# Patient Record
Sex: Female | Born: 2000 | Race: White | Hispanic: No | Marital: Single | Smoking: Never smoker
Health system: Southern US, Community
[De-identification: ages and names within clinical notes are randomized; demographics above are authoritative.]

## PROBLEM LIST (undated history)

## (undated) ENCOUNTER — Inpatient Hospital Stay (HOSPITAL_COMMUNITY): Payer: Self-pay

## (undated) DIAGNOSIS — F32A Depression, unspecified: Secondary | ICD-10-CM

## (undated) DIAGNOSIS — F419 Anxiety disorder, unspecified: Secondary | ICD-10-CM

## (undated) DIAGNOSIS — F329 Major depressive disorder, single episode, unspecified: Secondary | ICD-10-CM

## (undated) HISTORY — PX: WISDOM TOOTH EXTRACTION: SHX21

---
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
## 2022-02-19 DIAGNOSIS — Z23 Encounter for immunization: Secondary | ICD-10-CM | POA: Diagnosis not present

## 2022-03-02 DIAGNOSIS — R109 Unspecified abdominal pain: Secondary | ICD-10-CM | POA: Diagnosis not present

## 2022-05-02 ENCOUNTER — Ambulatory Visit
Admission: RE | Admit: 2022-05-02 | Discharge: 2022-05-02 | Disposition: A | Discharge status: Home / Self Care | Payer: BC Managed Care – PPO | Source: Ambulatory Visit

## 2022-05-02 VITALS — BP 124/83 | HR 107 | Temp 97.8°F | Resp 18

## 2022-05-02 DIAGNOSIS — R112 Nausea with vomiting, unspecified: Secondary | ICD-10-CM

## 2022-05-02 DIAGNOSIS — R197 Diarrhea, unspecified: Secondary | ICD-10-CM

## 2022-05-02 DIAGNOSIS — A084 Viral intestinal infection, unspecified: Secondary | ICD-10-CM

## 2022-05-02 MED ORDER — ONDANSETRON 4 MG PO TBDP: 4.0000 mg | ORAL_TABLET | Freq: Three times a day (TID) | ORAL | 0 refills | Status: DC | PRN

## 2022-05-02 NOTE — Discharge Instructions (Addendum)
Take the antinausea medication as directed.    Keep yourself hydrated with clear liquids, such as water and Gatorade.  Follow the diarrhea diet as tolerated.   Go to the emergency department if you have worsening symptoms.    Follow up with your primary care provider.      

## 2022-05-02 NOTE — ED Triage Notes (Signed)
Patient to Urgent Care with complaints of NVD/ fatigue/ fevers. Employed at a [employer] office. Unknown sick contacts.   Reports multiple episodes of emesis last night, low grade fever.

## 2022-05-02 NOTE — ED Provider Notes (Signed)
Roderic Palau    CSN: [id number] Arrival date & time: 05/02/22  0935      History   Chief Complaint Chief Complaint  Patient presents with   Diarrhea    Chills, back pain, vomiting, fatigue, fever - Entered by patient    HPI Shannon Murillo is a 22 y.o. female.  Patient presents with low-grade fever of 99.5, nausea, vomiting, diarrhea since yesterday.  No emesis this morning.  2 episodes of diarrhea this morning.  Patient has been able to drink sips of water without vomiting today.  She took Tylenol at 0200 this morning; no medications taken since then.  She denies rash, sore throat, cough, shortness of breath, abdominal pain, flank pain, dysuria, hematuria, or other symptoms.  No recent travel or antibiotic use.   The history is provided by the patient and medical records.    Past Medical History:  Diagnosis Date   Anxiety    Depression     Patient Active Problem List   Diagnosis Date Noted   Epigastric pain 04/17/2019   Bilious vomiting with nausea 04/17/2019   Constipation 04/17/2019   Dysmenorrhea 01/23/2019    Past Surgical History:  Procedure Laterality Date   WISDOM TOOTH EXTRACTION      OB History   No obstetric history on file.      Home Medications    Prior to Admission medications   Medication Sig Start Date End Date Taking? Authorizing Provider  busPIRone (BUSPAR) 5 MG tablet Take 5 mg by mouth 2 (two) times daily. 11/04/21  Yes [provider]  lamoTRIgine (LAMICTAL) 25 MG tablet Take 50 mg by mouth 2 (two) times daily. 11/04/21  Yes [provider]  ondansetron (ZOFRAN-ODT) 4 MG disintegrating tablet Take 1 tablet (4 mg total) by mouth every 8 (eight) hours as needed for nausea or vomiting. 05/02/22  Yes Sharion Balloon, NP  cyclobenzaprine (FLEXERIL) 10 MG tablet Take 1 tablet 4 hours prior to procedure. Then, ok to take 1 tablet after if needed for cramping. Take 800 mg ibuprofen with first dose. Patient not taking: Reported  on 09/29/2021 07/10/21   Trude Mcburney, FNP    Family History No family history on file.  Social History Social History   Tobacco Use   Smoking status: Never   Smokeless tobacco: Never  Substance Use Topics   Alcohol use: No   Drug use: Never     Allergies   Patient has no known allergies.   Review of Systems Review of Systems  Constitutional:  Positive for chills and fever.  HENT:  Negative for ear pain and sore throat.   Respiratory:  Negative for cough and shortness of breath.   Cardiovascular:  Negative for chest pain and palpitations.  Gastrointestinal:  Positive for diarrhea, nausea and vomiting. Negative for abdominal pain.  Genitourinary:  Negative for dysuria and hematuria.  Skin:  Negative for rash.  All other systems reviewed and are negative.    Physical Exam Triage Vital Signs ED Triage Vitals  Enc Vitals Group     BP 05/02/22 0947 124/83     Pulse Rate 05/02/22 0947 (!) 107     Resp 05/02/22 0947 18     Temp 05/02/22 0947 97.8 F (36.6 C)     Temp src --      SpO2 05/02/22 0947 96 %     Weight --      Height --      Head Circumference --  Peak Flow --      Pain Score 05/02/22 0949 1     Pain Loc --      Pain Edu? --      Excl. in [place]? --    No data found.  Updated Vital Signs BP 124/83   Pulse (!) 107   Temp 97.8 F (36.6 C)   Resp 18   SpO2 96%   Visual Acuity Right Eye Distance:   Left Eye Distance:   Bilateral Distance:    Right Eye Near:   Left Eye Near:    Bilateral Near:     Physical Exam Vitals and nursing note reviewed.  Constitutional:      General: She is not in acute distress.    Appearance: Normal appearance. She is well-developed. She is not ill-appearing.  HENT:     Mouth/Throat:     Mouth: Mucous membranes are moist.     Pharynx: Oropharynx is clear.  Cardiovascular:     Rate and Rhythm: Normal rate and regular rhythm.     Heart sounds: Normal heart sounds.  Pulmonary:     Effort: Pulmonary effort  is normal. No respiratory distress.     Breath sounds: Normal breath sounds.  Abdominal:     General: Bowel sounds are normal.     Palpations: Abdomen is soft.     Tenderness: There is no abdominal tenderness. There is no right CVA tenderness, left CVA tenderness, guarding or rebound.  Musculoskeletal:     Cervical back: Neck supple.  Skin:    General: Skin is warm and dry.  Neurological:     Mental Status: She is alert.  Psychiatric:        Mood and Affect: Mood normal.        Behavior: Behavior normal.      UC Treatments / Results  Labs (all labs ordered are listed, but only abnormal results are displayed) Labs Reviewed - No data to display  EKG   Radiology No results found.  Procedures Procedures (including critical care time)  Medications Ordered in UC Medications - No data to display  Initial Impression / Assessment and Plan / UC Course  I have reviewed the triage vital signs and the nursing notes.  Pertinent labs & imaging results that were available during my care of the patient were reviewed by me and considered in my medical decision making (see chart for details).    Nausea, vomiting, diarrhea. Viral gastroenteritis.  Treating nausea and vomiting with Zofran.  Discussed clear liquid diet.  Instructed patient to advance to diarrhea diet as tolerated.  Discussed maintaining oral hydration at home; ED precautions discussed.  Education provided on nausea and vomiting, diarrhea.  Instructed patient to follow-up with her PCP as needed.  She agrees to plan of care.   Final Clinical Impressions(s) / UC Diagnoses   Final diagnoses:  Nausea vomiting and diarrhea  Viral gastroenteritis     Discharge Instructions      Take the antinausea medication as directed.    Keep yourself hydrated with clear liquids, such as water and Gatorade.  Follow the diarrhea diet as tolerated.   Go to the emergency department if you have worsening symptoms.    Follow up with  your primary care provider.          ED Prescriptions     Medication Sig Dispense Auth. Provider   ondansetron (ZOFRAN-ODT) 4 MG disintegrating tablet Take 1 tablet (4 mg total) by mouth every 8 (eight) hours  as needed for nausea or vomiting. 20 tablet Sharion Balloon, NP      PDMP not reviewed this encounter.   Sharion Balloon, NP 05/02/22 1025

## 2022-05-06 ENCOUNTER — Telehealth (INDEPENDENT_AMBULATORY_CARE_PROVIDER_SITE_OTHER): Payer: BC Managed Care – PPO | Admitting: Family

## 2022-05-06 ENCOUNTER — Encounter: Payer: Self-pay | Admitting: Family

## 2022-05-06 DIAGNOSIS — Z975 Presence of (intrauterine) contraceptive device: Secondary | ICD-10-CM

## 2022-05-06 DIAGNOSIS — N946 Dysmenorrhea, unspecified: Secondary | ICD-10-CM | POA: Diagnosis not present

## 2022-05-06 NOTE — Progress Notes (Addendum)
THIS RECORD MAY CONTAIN CONFIDENTIAL INFORMATION THAT SHOULD NOT BE RELEASED WITHOUT REVIEW OF THE SERVICE PROVIDER.  Virtual Follow-Up Visit via Video Note  I connected with Shannon Murillo   on 05/06/22 at 11:00 AM EDT by a video enabled telemedicine application and verified that I am speaking with the correct person using two identifiers.   Patient/parent location: home  Provider location: remote [place]   I discussed the limitations of evaluation and management by telemedicine and the availability of in person appointments.  I discussed that the purpose of this telehealth visit is to provide medical care while limiting exposure to the novel coronavirus.  The patient expressed understanding and agreed to proceed.   Shannon Murillo is a 22 y.o. female referred by Jamie Kato, MD here today for follow-up of IUD in place.   History was provided by the patient.  Supervising Physician: Dr. Roselind Messier   Plan from Last Visit:   1. Encounter for routine checking of intrauterine contraceptive device (IUD) - IUD placement verified by Hoyt Koch, NP in correct stable location - discussed normal cramping and spotting with IUD, can treat cramping with Ibuprofen PRN, can keep symptom journal - discussed concerning symptoms / reasons to return to care including severe abdominal pain/cramping, unusual bleeding, fever or chills, foul-smelling discharge, or painful intercourse   2. Vaginal discharge - Mild cervical discharge physiologically normal, no cervical motion tenderness on exam - discharge and itching/burning improved with fluconazole treatment from PCP, likely yeast infection, however discussed reasons to return for care and the option of STI testing today. Patient has been sexually active but with same monogamous partner, STI testing last visit was negative, reasonable to defer today but discussed if symptoms returned we should pursue testing. She has MyChart and the clinic phone  number.  Chief Complaint: Desires IUD removal   History of Present Illness:  -is feeling better after recent stomach bug; still not great holding food down at times -wants IUD out  -has not had period in 3-4 years and feels like she wants body to have a break from hormones  -has random menstrual cramping but nothing she is ever worried about  -is sexually active  -denies pregnancy intention   No Known Allergies Outpatient Medications Prior to Visit  Medication Sig Dispense Refill   busPIRone (BUSPAR) 5 MG tablet Take 5 mg by mouth 2 (two) times daily.     cyclobenzaprine (FLEXERIL) 10 MG tablet Take 1 tablet 4 hours prior to procedure. Then, ok to take 1 tablet after if needed for cramping. Take 800 mg ibuprofen with first dose. (Patient not taking: Reported on 09/29/2021) 2 tablet 0   lamoTRIgine (LAMICTAL) 25 MG tablet Take 50 mg by mouth 2 (two) times daily.     ondansetron (ZOFRAN-ODT) 4 MG disintegrating tablet Take 1 tablet (4 mg total) by mouth every 8 (eight) hours as needed for nausea or vomiting. 20 tablet 0   No facility-administered medications prior to visit.     Patient Active Problem List   Diagnosis Date Noted   Epigastric pain 04/17/2019   Bilious vomiting with nausea 04/17/2019   Constipation 04/17/2019   Dysmenorrhea 01/23/2019   The following portions of the patient's history were reviewed and updated as appropriate: allergies, current medications, past family history, past medical history, past social history, past surgical history, and problem list.  Visual Observations/Objective:   General Appearance: Well nourished well developed, in no apparent distress.  Eyes: conjunctiva no swelling or erythema ENT/Mouth:  No hoarseness, No cough for duration of visit.  Neck: Supple  Respiratory: Respiratory effort normal, normal rate, no retractions or distress.   Cardio: Appears well-perfused, noncyanotic Musculoskeletal: no obvious deformity Skin: visible skin  without rashes, ecchymosis, erythema Neuro: Awake and oriented X 3,  Psych:  normal affect, Insight and Judgment appropriate.    Assessment/Plan: 1. Dysmenorrhea 2. IUD (intrauterine device) in place -reassurance given that menstrual suppression with IUD is common and not worrisome; there is no reason for concern for endometrial hyperplasia with IUD use; discussed return to fertility is unknown but should assume risk for pregnancy returns immediately upon IUD removal  -would recommend prenatal or daily women's vitamin with folic acid if no contraceptive in place     I discussed the assessment and treatment plan with the patient and/or parent/guardian.  They were provided an opportunity to ask questions and all were answered.  They agreed with the plan and demonstrated an understanding of the instructions. They were advised to call back or seek an in-person evaluation in the emergency room if the symptoms worsen or if the condition fails to improve as anticipated.   Follow-up:  return for IUD removal at patient's [place], NP    CC: Jamie Kato, MD, Jamie Kato, MD

## 2022-05-15 ENCOUNTER — Ambulatory Visit (INDEPENDENT_AMBULATORY_CARE_PROVIDER_SITE_OTHER): Payer: BC Managed Care – PPO | Admitting: Family

## 2022-05-15 ENCOUNTER — Encounter: Payer: Self-pay | Admitting: Family

## 2022-05-15 VITALS — BP 127/81 | HR 77 | Ht 66.0 in | Wt 174.2 lb

## 2022-05-15 DIAGNOSIS — N946 Dysmenorrhea, unspecified: Secondary | ICD-10-CM

## 2022-05-15 DIAGNOSIS — Z113 Encounter for screening for infections with a predominantly sexual mode of transmission: Secondary | ICD-10-CM

## 2022-05-15 DIAGNOSIS — Z30432 Encounter for removal of intrauterine contraceptive device: Secondary | ICD-10-CM

## 2022-05-15 NOTE — Progress Notes (Signed)
History was provided by the patient.  Shannon Murillo is a 22 y.o. female who is here for IUD removal.   PCP confirmed? Yes.    Jamie Kato, MD  HPI:   -has not had period with IUD  -wants removal to have her hormones regulated  -denies pregnancy intent but is willing to start prenatal or DMV with folic acid  -does not want any birth control at this time -safe in relationships   Patient Active Problem List   Diagnosis Date Noted   Epigastric pain 04/17/2019   Bilious vomiting with nausea 04/17/2019   Constipation 04/17/2019   Dysmenorrhea 01/23/2019    Current Outpatient Medications on File Prior to Visit  Medication Sig Dispense Refill   busPIRone (BUSPAR) 5 MG tablet Take 5 mg by mouth 2 (two) times daily.     lamoTRIgine (LAMICTAL) 25 MG tablet Take 50 mg by mouth 2 (two) times daily.     cyclobenzaprine (FLEXERIL) 10 MG tablet Take 1 tablet 4 hours prior to procedure. Then, ok to take 1 tablet after if needed for cramping. Take 800 mg ibuprofen with first dose. (Patient not taking: Reported on 09/29/2021) 2 tablet 0   ondansetron (ZOFRAN-ODT) 4 MG disintegrating tablet Take 1 tablet (4 mg total) by mouth every 8 (eight) hours as needed for nausea or vomiting. (Patient not taking: Reported on 05/15/2022) 20 tablet 0   No current facility-administered medications on file prior to visit.    No Known Allergies  Physical Exam:    Vitals:   05/15/22 1101  BP: 127/81  Pulse: 77  Weight: 174 lb 3.2 oz (79 kg)  Height: 5\' 6"  (1.676 m)    Blood pressure %iles are not available for patients who are 18 years or older. No LMP recorded. (Menstrual status: IUD).  Physical Exam Exam conducted with a chaperone present.  Constitutional:      General: She is not in acute distress.    Appearance: She is well-developed.  HENT:     Head: Normocephalic and atraumatic.  Eyes:     General: No scleral icterus.    Pupils: Pupils are equal, round, and reactive to light.  Neck:      Thyroid: No thyromegaly.  Cardiovascular:     Rate and Rhythm: Normal rate and regular rhythm.     Heart sounds: Normal heart sounds. No murmur heard. Pulmonary:     Effort: Pulmonary effort is normal.     Breath sounds: Normal breath sounds.  Genitourinary:    Vagina: Normal.     Cervix: Discharge present.     Uterus: Normal.      Comments: Strings visible at os prior to removal  White creamy discharge from os  Musculoskeletal:        General: Normal range of motion.     Cervical back: Normal range of motion and neck supple.  Lymphadenopathy:     Cervical: No cervical adenopathy.  Skin:    General: Skin is warm and dry.     Findings: No rash.  Neurological:     Mental Status: She is alert and oriented to person, place, and time.     Cranial Nerves: No cranial nerve deficit.  Psychiatric:        Behavior: Behavior normal.        Thought Content: Thought content normal.        Judgment: Judgment normal.       Assessment/Plan: 1. Dysmenorrhea 2. Encounter for IUD removal -removed without incident;  advised to return as needed for cramping or bleeding concerns; was well controlled with method -patient advised to return as needed  -start DMV or prenatal for folic acid coverage -reviewed EC use available as needed  -return in 3 months; cautioned about missed periods and risks for endometrial hyperplasia   3. Routine screening for STIs

## 2022-05-16 LAB — C. TRACHOMATIS/N. GONORRHOEAE RNA
C. trachomatis RNA, TMA: NOT DETECTED
N. gonorrhoeae RNA, TMA: NOT DETECTED

## 2022-07-31 DIAGNOSIS — M545 Low back pain, unspecified: Secondary | ICD-10-CM | POA: Diagnosis not present

## 2022-07-31 DIAGNOSIS — M549 Dorsalgia, unspecified: Secondary | ICD-10-CM | POA: Diagnosis not present

## 2022-08-17 ENCOUNTER — Ambulatory Visit: Payer: BC Managed Care – PPO | Admitting: Family

## 2022-09-03 ENCOUNTER — Encounter: Payer: Self-pay | Admitting: *Deleted

## 2022-09-18 DIAGNOSIS — F909 Attention-deficit hyperactivity disorder, unspecified type: Secondary | ICD-10-CM | POA: Diagnosis not present

## 2022-10-07 DIAGNOSIS — F909 Attention-deficit hyperactivity disorder, unspecified type: Secondary | ICD-10-CM | POA: Diagnosis not present

## 2022-12-07 ENCOUNTER — Ambulatory Visit
Admission: EM | Admit: 2022-12-07 | Discharge: 2022-12-07 | Disposition: A | Discharge status: Home / Self Care | Payer: BC Managed Care – PPO

## 2022-12-07 DIAGNOSIS — L02214 Cutaneous abscess of groin: Secondary | ICD-10-CM

## 2022-12-07 MED ORDER — CEPHALEXIN 500 MG PO CAPS: 500.0000 mg | ORAL_CAPSULE | Freq: Two times a day (BID) | ORAL | 0 refills | Status: DC

## 2022-12-07 NOTE — ED Provider Notes (Signed)
[facility]    CSN: [id number] Arrival date & time: 12/07/22  1544      History   Chief Complaint Chief Complaint  Patient presents with   Abscess    HPI Shannon Murillo is a 22 y.o. female.   Patient presenting today with new onset of a painful red bump to the left groin region that she first noticed this morning.  She denies injury to the area, drainage, fever, chills, rashes elsewhere.  So far not tried anything over-the-counter for symptoms.    Past Medical History:  Diagnosis Date   Anxiety    Depression     Patient Active Problem List   Diagnosis Date Noted   Epigastric pain 04/17/2019   Bilious vomiting with nausea 04/17/2019   Constipation 04/17/2019   Dysmenorrhea 01/23/2019    Past Surgical History:  Procedure Laterality Date   WISDOM TOOTH EXTRACTION      OB History   No obstetric history on file.      Home Medications    Prior to Admission medications   Medication Sig Start Date End Date Taking? Authorizing Provider  amphetamine-dextroamphetamine (ADDERALL) 5 MG tablet Take 1 tablet by mouth 2 (two) times daily. 11/27/22  Yes [provider]  cephALEXin (KEFLEX) 500 MG capsule Take 1 capsule (500 mg total) by mouth 2 (two) times daily. 12/07/22  Yes Particia Nearing, PA-C  busPIRone (BUSPAR) 5 MG tablet Take 5 mg by mouth 2 (two) times daily. 11/04/21   [provider]  lamoTRIgine (LAMICTAL) 25 MG tablet Take 50 mg by mouth 2 (two) times daily. 11/04/21   [provider]    Family History History reviewed. No pertinent family history.  Social History Social History   Tobacco Use   Smoking status: Never   Smokeless tobacco: Never  Substance Use Topics   Alcohol use: No   Drug use: Never     Allergies   Patient has no known allergies.   Review of Systems Review of Systems Per HPI  Physical Exam Triage Vital Signs ED Triage Vitals  Encounter Vitals Group     BP 12/07/22 1720  124/80     Systolic BP Percentile --      Diastolic BP Percentile --      Pulse Rate 12/07/22 1720 76     Resp 12/07/22 1720 16     Temp 12/07/22 1720 98.7 F (37.1 C)     Temp Source 12/07/22 1720 Oral     SpO2 12/07/22 1720 98 %     Weight --      Height --      Head Circumference --      Peak Flow --      Pain Score 12/07/22 1726 2     Pain Loc --      Pain Education --      Exclude from Growth Chart --    No data found.  Updated Vital Signs BP 124/80 (BP Location: Right Arm)   Pulse 76   Temp 98.7 F (37.1 C) (Oral)   Resp 16   LMP 11/24/2022 (Approximate)   SpO2 98%   Visual Acuity Right Eye Distance:   Left Eye Distance:   Bilateral Distance:    Right Eye Near:   Left Eye Near:    Bilateral Near:     Physical Exam Vitals and nursing note reviewed.  Constitutional:      Appearance: Normal appearance. She is not ill-appearing.  HENT:  Head: Atraumatic.     Mouth/Throat:     Mouth: Mucous membranes are moist.  Eyes:     Extraocular Movements: Extraocular movements intact.     Conjunctiva/sclera: Conjunctivae normal.  Cardiovascular:     Rate and Rhythm: Normal rate and regular rhythm.     Heart sounds: Normal heart sounds.  Pulmonary:     Effort: Pulmonary effort is normal.     Breath sounds: Normal breath sounds.  Musculoskeletal:        General: Normal range of motion.     Cervical back: Normal range of motion and neck supple.  Skin:    General: Skin is warm.     Comments: 1 cm erythematous fluctuant mass to the left groin, no active drainage, tender to palpation  Neurological:     Mental Status: She is alert and oriented to person, place, and time.     Motor: No weakness.     Gait: Gait normal.  Psychiatric:        Mood and Affect: Mood normal.        Thought Content: Thought content normal.        Judgment: Judgment normal.      UC Treatments / Results  Labs (all labs ordered are listed, but only abnormal results are  displayed) Labs Reviewed - No data to display  EKG   Radiology No results found.  Procedures Procedures (including critical care time)  Medications Ordered in UC Medications - No data to display  Initial Impression / Assessment and Plan / UC Course  I have reviewed the triage vital signs and the nursing notes.  Pertinent labs & imaging results that were available during my care of the patient were reviewed by me and considered in my medical decision making (see chart for details).     Consistent with an early abscess, treat with Keflex, warm compresses, ibuprofen as needed for pain.  Return for worsening symptoms.  Final Clinical Impressions(s) / UC Diagnoses   Final diagnoses:  Groin abscess   Discharge Instructions   None    ED Prescriptions     Medication Sig Dispense Auth. Provider   cephALEXin (KEFLEX) 500 MG capsule Take 1 capsule (500 mg total) by mouth 2 (two) times daily. 14 capsule Particia Nearing, [place]      PDMP not reviewed this encounter.   Particia Nearing, [place] 12/07/22 1905

## 2022-12-07 NOTE — ED Triage Notes (Signed)
Pt presents with medium size bump to left groin/thigh that appeared this morning. Pt states it is painful when touched or walking.

## 2022-12-30 ENCOUNTER — Telehealth: Payer: BC Managed Care – PPO | Admitting: Physician Assistant

## 2022-12-30 DIAGNOSIS — R82998 Other abnormal findings in urine: Secondary | ICD-10-CM

## 2022-12-30 NOTE — Progress Notes (Signed)
Because of dark red/brown urine and need for urinalysis and examination, I feel your condition warrants further evaluation and I recommend that you be seen in a face to face visit.   NOTE: There will be NO CHARGE for this eVisit   If you are having a true medical emergency please call 911.      For an urgent face to face visit, [place] has eight urgent care centers for your convenience:   NEW!! [facility] at [facility] Get Driving Directions [phone] [address] [place], [address]    [facility] at [facility] Get Driving Directions [phone] [address] [place], [place] [address]   [facility] Center [facility]) Get Driving Directions [phone] [address] [place], [place] [address]  [facility]) Get Driving Directions [phone] [address] [place],  [place]  [address]  [facility] - at [employer]  [phone] [address] [place],  [place] [address]   [facility] at [facility] Get Driving Directions [phone] [address] [place], [place] [address]   [facility] Ur[facility] Care at [facility] Get Driving Directions  [phone] [address] [place], [place] [address]   [facility] Urgent Care at [facility] Directions [phone] [address] [place], [place] [address]  Your MyChart E-visit questionnaire answers were reviewed by a board certified advanced clinical practitioner to complete your personal care plan based on your specific symptoms.  Thank you for using e-Visits.

## 2022-12-31 ENCOUNTER — Ambulatory Visit: Payer: Medicaid Other | Admitting: Nurse Practitioner

## 2022-12-31 ENCOUNTER — Other Ambulatory Visit (HOSPITAL_COMMUNITY): Payer: Self-pay

## 2022-12-31 ENCOUNTER — Encounter: Payer: Self-pay | Admitting: Nurse Practitioner

## 2022-12-31 ENCOUNTER — Other Ambulatory Visit: Payer: Self-pay

## 2022-12-31 VITALS — BP 122/74 | HR 84 | Temp 98.2°F

## 2022-12-31 DIAGNOSIS — N3000 Acute cystitis without hematuria: Secondary | ICD-10-CM

## 2022-12-31 HISTORY — DX: Acute cystitis without hematuria: N30.00

## 2022-12-31 MED ORDER — SULFAMETHOXAZOLE-TRIMETHOPRIM 800-160 MG PO TABS
1.0000 | ORAL_TABLET | Freq: Two times a day (BID) | ORAL | 0 refills | Status: AC
Start: 2022-12-31 — End: 2023-01-03
  Filled 2022-12-31: qty 6, 3d supply, fill #0

## 2022-12-31 NOTE — Patient Instructions (Signed)
Start Bactrim DS Twice daily for 3 days for UTI. Take with food   Go to [employer] for urine sample  Follow up as needed

## 2022-12-31 NOTE — Progress Notes (Signed)
@Patient  ID: Shannon Murillo, female    DOB: 2000-10-23, 22 y.o.   MRN: [id number]  Chief Complaint  Patient presents with   Urinary Tract Infection    Referring provider: Izola Price, MD  HPI: 22 year old female, never smoker with past medical history of anxiety and ADHD.  TEST/EVENTS:   12/31/2022: Today - acute Discussed the use of AI scribe software for clinical note transcription with the patient, who gave verbal consent to proceed.  History of Present Illness   The patient presented with symptoms of a urinary tract infection (UTI) that began two days prior to the consultation. She reported increased urinary frequency and mild irritation during urination, but denied any hematuria. The patient also noted a slight change in the odor of her urine and mild lower back pain. However, she denied any systemic symptoms such as fever, chills, nausea, or vomiting, and reported normal appetite and fluid intake.  In addition to the urinary symptoms, the patient also reported a white vaginal discharge. She denied any other vaginal symptoms. Her last menstrual period was approximately three weeks prior to the consultation. She is sexually active and utilizes birth control.   The patient has no known drug allergies.       No Known Allergies   There is no immunization history on file for this patient.  Past Medical History:  Diagnosis Date   Anxiety    Depression     Tobacco History: Social History   Tobacco Use  Smoking Status Never  Smokeless Tobacco Never   Counseling given: Not Answered   Outpatient Medications Prior to Visit  Medication Sig Dispense Refill   amphetamine-dextroamphetamine (ADDERALL) 5 MG tablet Take 1 tablet by mouth 2 (two) times daily.     busPIRone (BUSPAR) 5 MG tablet Take 5 mg by mouth 2 (two) times daily.     cephALEXin (KEFLEX) 500 MG capsule Take 1 capsule (500 mg total) by mouth 2 (two) times daily. 14 capsule 0   lamoTRIgine (LAMICTAL) 25  MG tablet Take 50 mg by mouth 2 (two) times daily.     No facility-administered medications prior to visit.     Review of Systems:   Constitutional: No weight loss or gain, night sweats, fevers, chills, fatigue, or lassitude. CV:  No chest pain, dizziness, palpitations, syncope Resp: No shortness of breath with exertion or at rest.  GI:  No heartburn, indigestion, abdominal pain, nausea, vomiting, diarrhea, change in bowel habits, loss of appetite, bloody stools.  GU: +dysuria, urgency, frequency, mild flank pain, vaginal odorless discharge. No hematuria, itching, vaginal pain, abnormal bleeding, missed menstrual cycle Skin: No rash, lesions, ulcerations MSK:  No joint pain or swelling.  No decreased range of motion.  No back pain. Neuro: No dizziness or lightheadedness.  Psych: No depression or anxiety. Mood stable.     Physical Exam:  BP 122/74 (BP Location: Left Arm, Patient Position: Sitting, Cuff Size: Normal)   Pulse 84   Temp 98.2 F (36.8 C) (Oral)   LMP 11/24/2022 (Approximate)   SpO2 97%   GEN: Pleasant, interactive, well-appearing; in no acute distress HEENT:  Normocephalic and atraumatic. PERRLA. Sclera white. Nasal turbinates pink, moist and patent bilaterally. No rhinorrhea present. Oropharynx pink and moist, without exudate or edema. No lesions, ulcerations, or postnasal drip.  NECK:  Supple w/ fair ROM. No JVD present. No lymphadenopathy.   CV: RRR, no m/r/g, no peripheral edema. Pulses intact, +2 bilaterally. No cyanosis, pallor or clubbing. PULMONARY:  Unlabored, regular breathing.  Clear bilaterally A&P w/o wheezes/rales/rhonchi. No accessory muscle use.  GI: BS present and normoactive. Soft, non-tender to palpation. No organomegaly or masses detected. No CVA tenderness. MSK: No erythema, warmth or tenderness. Cap refil <2 sec all extrem.  Neuro: A/Ox3. No focal deficits noted.   Skin: Warm, no lesions or rashe Psych: Normal affect and behavior. Judgement and  thought content appropriate.     Lab Results:  CBC    Component Value Date/Time   WBC 7.2 12/11/2019 1140   RBC 4.66 12/11/2019 1140   HGB 14.5 12/11/2019 1140   HCT 43.1 12/11/2019 1140   PLT 334 12/11/2019 1140   MCV 92.5 12/11/2019 1140   MCH 31.1 12/11/2019 1140   MCHC 33.6 12/11/2019 1140   RDW 11.8 12/11/2019 1140   LYMPHSABS 1,908 12/11/2019 1140   MONOABS 0.8 11/18/2018 0114   EOSABS 79 12/11/2019 1140   BASOSABS 50 12/11/2019 1140    BMET    Component Value Date/Time   NA 138 12/11/2019 1140   K 4.2 12/11/2019 1140   CL 104 12/11/2019 1140   CO2 24 12/11/2019 1140   GLUCOSE 93 12/11/2019 1140   BUN 6 (L) 12/11/2019 1140   CREATININE 0.75 12/11/2019 1140   CALCIUM 9.8 12/11/2019 1140   GFRNONAA NOT CALCULATED 11/18/2018 0114   GFRAA NOT CALCULATED 11/18/2018 0114    BNP No results found for: "BNP"   Imaging:  No results found.  Administration History     None           No data to display          No results found for: "NITRICOXIDE"      Assessment & Plan:   Acute cystitis    Urinary Tract Infection (UTI) Symptoms of UTI. Empiric abx coverage with Bactrim. Differential includes yeast infection due to vaginal symptoms. Informed about Azo with pyridium for symptom relief, which may cause orange urine discoloration. Advised follow-up with primary care physician if symptoms do not resolve for further testing.  - Prescribe empiric bactrim DS bid for 3 days. Side effect profile reviewed  - Recommend over-the-counter Azo with pyridium for symptom relief - Order urinalysis (UA) before starting antibiotics - If symptoms persist, may consider treatment for candidiasis   - Follow up with primary care physician if symptoms do not resolve  - No missed periods to indicate pregnancy testing at this time; utilizing birth control appropriately        Advised if symptoms do not improve or worsen, to please contact office for sooner follow up or  seek emergency care.   I spent 31 minutes of dedicated to the care of this patient on the date of this encounter to include pre-visit review of records, face-to-face time with the patient discussing conditions above, post visit ordering of testing, clinical documentation with the electronic health record, making appropriate referrals as documented, and communicating necessary findings to members of the patients care team.  Noemi Chapel, NP 01/01/2023  Pt aware and understands NP's role.

## 2022-12-31 NOTE — Assessment & Plan Note (Signed)
  Urinary Tract Infection (UTI) Symptoms of UTI. Empiric abx coverage with Bactrim. Differential includes yeast infection due to vaginal symptoms. Informed about Azo with pyridium for symptom relief, which may cause orange urine discoloration. Advised follow-up with primary care physician if symptoms do not resolve for further testing.  - Prescribe empiric bactrim DS bid for 3 days. Side effect profile reviewed  - Recommend over-the-counter Azo with pyridium for symptom relief - Order urinalysis (UA) before starting antibiotics - If symptoms persist, may consider treatment for candidiasis   - Follow up with primary care physician if symptoms do not resolve  - No missed periods to indicate pregnancy testing at this time; utilizing birth control appropriately

## 2023-06-18 ENCOUNTER — Other Ambulatory Visit (HOSPITAL_COMMUNITY): Payer: Self-pay

## 2023-07-26 ENCOUNTER — Other Ambulatory Visit: Payer: Self-pay | Admitting: Primary Care

## 2023-07-26 MED ORDER — SULFAMETHOXAZOLE-TRIMETHOPRIM 800-160 MG PO TABS: 1.0000 | ORAL_TABLET | Freq: Two times a day (BID) | ORAL | 0 refills | Status: DC

## 2023-07-26 NOTE — Progress Notes (Signed)
 Patient having symptoms hematuria and dysuria x 4-5 days  Sending in Bactrim  DS for suspected UTI

## 2023-09-06 ENCOUNTER — Encounter: Payer: Self-pay | Admitting: Family

## 2023-09-06 ENCOUNTER — Ambulatory Visit: Admitting: Family

## 2023-09-06 VITALS — BP 135/81 | HR 93 | Ht 65.55 in | Wt 158.6 lb

## 2023-09-06 DIAGNOSIS — Z3201 Encounter for pregnancy test, result positive: Secondary | ICD-10-CM | POA: Diagnosis not present

## 2023-09-06 DIAGNOSIS — Z113 Encounter for screening for infections with a predominantly sexual mode of transmission: Secondary | ICD-10-CM | POA: Diagnosis not present

## 2023-09-06 LAB — POCT URINE PREGNANCY: Preg Test, Ur: POSITIVE — AB

## 2023-09-06 NOTE — Progress Notes (Signed)
 History was provided by the patient.  Shannon Murillo is a 23 y.o. female who is here for positive home pregnancy test.   PCP confirmed? Yes.    Clide Asberry BRAVO, MD  Plan from last visit:  1. Dysmenorrhea 2. Encounter for IUD removal -removed without incident; advised to return as needed for cramping or bleeding concerns; was well controlled with method -patient advised to return as needed  -start DMV or prenatal for folic acid coverage -reviewed EC use available as needed  -return in 3 months; cautioned about missed periods and risks for endometrial hyperplasia    3. Routine screening for STIs   Pertinent Labs:  +UPT today   HPI:   -has been consistently about 4-5 days late the last several  -June LMP 6/9 was regular period and none since then  -positive HPT Saturday x 4, super quick result -no bleeding, no cramping  -no prenatal  -has been feeling nauseous x a couple weeks -not taking Buspar, Lamictal - stopped taking Adderall after she spoke to NP with PCP's office   Patient Active Problem List   Diagnosis Date Noted   Acute cystitis 12/31/2022   Epigastric pain 04/17/2019   Bilious vomiting with nausea 04/17/2019   Constipation 04/17/2019   Dysmenorrhea 01/23/2019    Current Outpatient Medications on File Prior to Visit  Medication Sig Dispense Refill   amphetamine-dextroamphetamine (ADDERALL) 5 MG tablet Take 1 tablet by mouth 2 (two) times daily. (Patient not taking: Reported on 09/06/2023)     busPIRone (BUSPAR) 5 MG tablet Take 5 mg by mouth 2 (two) times daily. (Patient not taking: Reported on 09/06/2023)     lamoTRIgine (LAMICTAL) 25 MG tablet Take 50 mg by mouth 2 (two) times daily. (Patient not taking: Reported on 09/06/2023)     No current facility-administered medications on file prior to visit.    No Known Allergies  Physical Exam:    Vitals:   09/06/23 1334  BP: 135/81  Pulse: 93  Weight: 158 lb 9.6 oz (71.9 kg)  Height: 5' 5.55 (1.665 m)     Growth %ile SmartLinks can only be used for patients less than 46 years old. No LMP recorded.  Physical Exam Constitutional:      General: She is not in acute distress.    Appearance: She is well-developed.  HENT:     Head: Normocephalic and atraumatic.     Mouth/Throat:     Mouth: Mucous membranes are moist.  Eyes:     General: No scleral icterus.    Extraocular Movements: Extraocular movements intact.     Pupils: Pupils are equal, round, and reactive to light.  Neck:     Thyroid: No thyromegaly.  Cardiovascular:     Rate and Rhythm: Normal rate and regular rhythm.     Heart sounds: Normal heart sounds. No murmur heard. Pulmonary:     Effort: Pulmonary effort is normal.     Breath sounds: Normal breath sounds.  Musculoskeletal:        General: Normal range of motion.     Cervical back: Normal range of motion and neck supple.  Lymphadenopathy:     Cervical: No cervical adenopathy.  Skin:    General: Skin is warm and dry.     Findings: No rash.  Neurological:     General: No focal deficit present.     Mental Status: She is alert and oriented to person, place, and time.  Psychiatric:        Behavior:  Behavior normal.        Thought Content: Thought content normal.        Judgment: Judgment normal.      Assessment/Plan: 1. Positive urine pregnancy test (Primary) -OB/GYN referral today - urgent  -strict ED precautions reviewed including pelvic/abdominal pain, bleeding  -start OTC prenatal vitamins  - POCT urine pregnancy - Ambulatory referral to Obstetrics / Gynecology  2. Routine screening for STI (sexually transmitted infection) - Urine cytology ancillary only

## 2023-09-07 LAB — URINE CYTOLOGY ANCILLARY ONLY
Chlamydia: NEGATIVE
Comment: NEGATIVE
Comment: NEGATIVE
Comment: NORMAL
Neisseria Gonorrhea: NEGATIVE
Trichomonas: NEGATIVE

## 2023-09-09 ENCOUNTER — Encounter: Payer: Self-pay | Admitting: Family

## 2023-09-09 ENCOUNTER — Other Ambulatory Visit: Payer: Self-pay | Admitting: Family

## 2023-09-09 DIAGNOSIS — Z3201 Encounter for pregnancy test, result positive: Secondary | ICD-10-CM

## 2023-10-04 DIAGNOSIS — Z8659 Personal history of other mental and behavioral disorders: Secondary | ICD-10-CM | POA: Insufficient documentation

## 2023-10-05 ENCOUNTER — Encounter: Payer: Self-pay | Admitting: Obstetrics and Gynecology

## 2023-10-05 ENCOUNTER — Telehealth

## 2023-10-05 VITALS — BP 117/67 | HR 103 | Temp 98.3°F

## 2023-10-05 DIAGNOSIS — Z3491 Encounter for supervision of normal pregnancy, unspecified, first trimester: Secondary | ICD-10-CM

## 2023-10-05 DIAGNOSIS — Z3490 Encounter for supervision of normal pregnancy, unspecified, unspecified trimester: Secondary | ICD-10-CM | POA: Insufficient documentation

## 2023-10-05 MED ORDER — PROMETHAZINE HCL 25 MG PO TABS
25.0000 mg | ORAL_TABLET | Freq: Four times a day (QID) | ORAL | 0 refills | Status: DC | PRN
Start: 2023-10-05 — End: 2023-11-19

## 2023-10-05 NOTE — Patient Instructions (Addendum)

## 2023-10-05 NOTE — Progress Notes (Signed)
 New OB Intake  I connected with Shannon Murillo  on 10/05/23 at 10:15 AM EDT by MyChart Video Visit and verified that I am speaking with the correct person using two identifiers. Nurse is located at [facility] and pt is located at home.  I discussed the limitations, risks, security and privacy concerns of performing an evaluation and management service by telephone and the availability of in person appointments. I also discussed with the patient that there may be a patient responsible charge related to this service. The patient expressed understanding and agreed to proceed.  I explained I am completing New OB Intake today. We discussed EDD of 05/08/2024 based on LMP of 08/02/2023. Pt is No obstetric history on file.. I reviewed her allergies, medications and Medical/Surgical/OB history.    Patient Active Problem List   Diagnosis Date Noted   Acute cystitis 12/31/2022   Epigastric pain 04/17/2019   Bilious vomiting with nausea 04/17/2019   Constipation 04/17/2019   Dysmenorrhea 01/23/2019     Concerns addressed today  Delivery Plans Plans to deliver at [facility]. Discussed the nature of our practice with multiple providers including residents and students as well as female and female providers. Due to the size of the practice, the delivering provider may not be the same as those providing prenatal care.   Patient is not interested in water birth.  MyChart/Babyscripts MyChart access verified. I explained pt will have some visits in office and some virtually. Babyscripts instructions given and order placed. Patient verifies receipt of registration text/e-mail. Account successfully created and app downloaded. If patient is a candidate for Optimized scheduling, add to sticky note.   Blood Pressure Cuff/Weight Scale Patient has private insurance; instructed to purchase blood pressure cuff and bring to first prenatal appt. Explained after first prenatal appt pt will check weekly and document in  Babyscripts.  Anatomy US  Explained first scheduled US  will be around 19 weeks. Anatomy US  scheduled for 12/17/2023 at 1:00pm.  Is patient a CenteringPregnancy candidate?  Declined Declined due to Declined to say   Is patient a Mom+Baby Combined Care candidate?  Declined   If accepted, confirm patient does not intend to move from the area for at least 12 months, then notify Mom+Baby staff  First visit review I reviewed new OB appt with patient. Explained pt will be seen by Dr. Izell at first visit. Discussed [place] genetic screening with patient. Panorama and Horizon. Routine prenatal labs is needed at new ob appointment.  Last Pap No results found for: DIAGPAP  Shannon Murillo, CMA 10/05/2023  10:06 AM

## 2023-10-13 ENCOUNTER — Ambulatory Visit (INDEPENDENT_AMBULATORY_CARE_PROVIDER_SITE_OTHER)

## 2023-10-13 ENCOUNTER — Other Ambulatory Visit: Payer: Self-pay | Admitting: Obstetrics and Gynecology

## 2023-10-13 ENCOUNTER — Other Ambulatory Visit: Payer: Self-pay

## 2023-10-13 DIAGNOSIS — Z3491 Encounter for supervision of normal pregnancy, unspecified, first trimester: Secondary | ICD-10-CM

## 2023-10-13 DIAGNOSIS — Z3A10 10 weeks gestation of pregnancy: Secondary | ICD-10-CM

## 2023-10-15 ENCOUNTER — Other Ambulatory Visit: Payer: Self-pay

## 2023-10-15 ENCOUNTER — Other Ambulatory Visit (HOSPITAL_COMMUNITY)
Admission: RE | Admit: 2023-10-15 | Discharge: 2023-10-15 | Disposition: A | Discharge status: Home / Self Care | Source: Ambulatory Visit | Attending: Obstetrics and Gynecology | Admitting: Obstetrics and Gynecology

## 2023-10-15 ENCOUNTER — Encounter: Payer: Self-pay | Admitting: Obstetrics and Gynecology

## 2023-10-15 ENCOUNTER — Ambulatory Visit: Payer: Self-pay | Admitting: Obstetrics and Gynecology

## 2023-10-15 VITALS — BP 125/88 | HR 93 | Wt 169.0 lb

## 2023-10-15 DIAGNOSIS — Z3481 Encounter for supervision of other normal pregnancy, first trimester: Secondary | ICD-10-CM | POA: Insufficient documentation

## 2023-10-15 DIAGNOSIS — Z3491 Encounter for supervision of normal pregnancy, unspecified, first trimester: Secondary | ICD-10-CM | POA: Diagnosis not present

## 2023-10-15 DIAGNOSIS — Z3A10 10 weeks gestation of pregnancy: Secondary | ICD-10-CM | POA: Diagnosis not present

## 2023-10-15 DIAGNOSIS — Z1332 Encounter for screening for maternal depression: Secondary | ICD-10-CM

## 2023-10-16 LAB — CBC/D/PLT+RPR+RH+ABO+RUBIGG...
Antibody Screen: NEGATIVE
Basophils Absolute: 0 x10E3/uL (ref 0.0–0.2)
Basos: 0 %
EOS (ABSOLUTE): 0.1 x10E3/uL (ref 0.0–0.4)
Eos: 1 %
HCV Ab: NONREACTIVE
HIV Screen 4th Generation wRfx: NONREACTIVE
Hematocrit: 39.1 % (ref 34.0–46.6)
Hemoglobin: 13.7 g/dL (ref 11.1–15.9)
Hepatitis B Surface Ag: NEGATIVE
Immature Grans (Abs): 0 x10E3/uL (ref 0.0–0.1)
Immature Granulocytes: 0 %
Lymphocytes Absolute: 2 x10E3/uL (ref 0.7–3.1)
Lymphs: 20 %
MCH: 32.5 pg (ref 26.6–33.0)
MCHC: 35 g/dL (ref 31.5–35.7)
MCV: 93 fL (ref 79–97)
Monocytes Absolute: 0.7 x10E3/uL (ref 0.1–0.9)
Monocytes: 7 %
Neutrophils Absolute: 7.2 x10E3/uL — ABNORMAL HIGH (ref 1.4–7.0)
Neutrophils: 72 %
Platelets: 318 x10E3/uL (ref 150–450)
RBC: 4.22 x10E6/uL (ref 3.77–5.28)
RDW: 12.3 % (ref 11.7–15.4)
RPR Ser Ql: NONREACTIVE
Rh Factor: POSITIVE
Rubella Antibodies, IGG: 1.64 {index} (ref >0.99)
WBC: 10.1 x10E3/uL (ref 3.4–10.8)

## 2023-10-16 LAB — HEMOGLOBIN A1C
Est. average glucose Bld gHb Est-mCnc: 97 mg/dL
Hgb A1c MFr Bld: 5 % (ref 4.8–5.6)

## 2023-10-16 LAB — HCV INTERPRETATION

## 2023-10-17 LAB — CULTURE, OB URINE

## 2023-10-17 LAB — URINE CULTURE, OB REFLEX

## 2023-10-18 NOTE — Progress Notes (Signed)
 New OB Note  10/15/2023   Clinic: Center for [facility]  Chief Complaint: new OB   Transfer of Care Patient: no  History of Present Illness: Shannon Murillo is a 23 y.o. G1P0 at 10/4 weeks (EDC 3/16, based on Patient's last menstrual period was 08/02/2023 (approximate).=10w,k u/s)  Pregnancy complicated by has Supervision of low-risk pregnancy on their problem list.   No SAB s/s. Mild nausea of pregnancy  ROS: A 12-point review of systems was performed and negative, except as stated in the above HPI.  OBGYN History: As per HPI. OB History  Gravida Para Term Preterm AB Living  1       SAB IAB Ectopic Multiple Live Births          # Outcome Date GA Lbr Len/2nd Weight Sex Type Anes PTL Lv  1 Current            History of pap smears: No.    Past Medical History: Past Medical History:  Diagnosis Date   Acute cystitis 12/31/2022   Anxiety    Bilious vomiting with nausea 04/17/2019   Constipation 04/17/2019   Depression    Dysmenorrhea 01/23/2019   Epigastric pain 04/17/2019   History of depression 10/04/2023   Hx of headache 01/23/2020    Past Surgical History: Past Surgical History:  Procedure Laterality Date   WISDOM TOOTH EXTRACTION      Family History:  She denies any history of mental retardation, birth defects or genetic disorders in her or the FOB's history  Social History:  Social History   Socioeconomic History   Marital status: Single    Spouse name: Not on file   Number of children: Not on file   Years of education: Not on file   Highest education level: Not on file  Occupational History   Not on file  Tobacco Use   Smoking status: Never   Smokeless tobacco: Never  Substance and Sexual Activity   Alcohol use: No   Drug use: Never   Sexual activity: Yes    Birth control/protection: None  Other Topics Concern   Not on file  Social History Narrative   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   She attends [employer].   She  lives with mom and stepfather.   She has two sisters.   Social Drivers of Corporate investment banker Strain: Not on file  Food Insecurity: No Food Insecurity (10/15/2023)   Hunger Vital Sign    Worried About Running Out of Food in the Last Year: Never true    Ran Out of Food in the Last Year: Never true  Transportation Needs: Not on file  Physical Activity: Not on file  Stress: Not on file  Social Connections: Not on file  Intimate Partner Violence: Not on file    Allergy: No Known Allergies  Health Maintenance:  Mammogram Up to Date: not applicable  Current Outpatient Medications: Prenatal vitamin @CMEDTAKING @  Physical Exam:   BP 125/88   Pulse 93   Wt 169 lb (76.7 kg)   LMP 08/02/2023 (Approximate)   BMI 27.65 kg/m  Body mass index is 27.65 kg/m. Contractions: Not present Vag. Bleeding: None. Fundal height: not applicable FHTs: 150s  General appearance: Well nourished, well developed female in no acute distress.  Neck:  Supple, normal appearance, and no thyromegaly  Cardiovascular: S1, S2 normal, no murmur, rub or gallop, regular rate and rhythm Respiratory:  Clear to auscultation bilateral. Normal respiratory effort Abdomen: positive  bowel sounds and no masses, hernias; diffusely non tender to palpation, non distended Breasts: declines any s/s. SABRA Neuro/Psych:  Normal mood and affect.  Skin:  Warm and dry.  Lymphatic:  No inguinal lymphadenopathy.   Pelvic exam: is not limited by body habitus EGBUS: within normal limits, Vagina: within normal limits and with no blood in the vault, Cervix: normal appearing cervix without discharge or lesions, closed/long/high, Uterus:  enlarged, c/w 10 week size, and Adnexa:  normal adnexa and no mass, fullness, tenderness  Laboratory: none  Imaging:  Bedside u/s with SLIUP c/w 10wks, normal FHR 150s  Assessment: patient doing well  Plan: 1. [redacted] weeks gestation of pregnancy (Primary) Offer afp next visit. Mfm anatomy  u/s already scheduled - Cytology - PAP - Culture, OB Urine - CBC/D/Plt+RPR+Rh+ABO+RubIgG... - Hemoglobin A1c  2. Encounter for supervision of other normal pregnancy in first trimester - PANORAMA PRENATAL TEST  3. Encounter for supervision of other normal pregnancy, first trimester - HORIZON Basic Panel  Problem list reviewed and updated.  Follow up in 5 weeks.  >50% of 30 min visit spent on counseling and coordination of care.  Return in about 5 weeks (around 11/19/2023) for in person, low risk ob, md or app.  Future Appointments  Date Time Provider Department Center  11/19/2023 10:55 AM Herchel, Gloris LABOR, MD [facility]  12/17/2023  1:00 PM [facility] PROVIDER 1 [facility]  12/17/2023  1:30 PM [facility] US1 [facility]    Bebe Izell Overcast MD Attending Center for [facility])

## 2023-10-19 LAB — CYTOLOGY - PAP
Chlamydia: NEGATIVE
Comment: NEGATIVE
Comment: NORMAL
Diagnosis: NEGATIVE
Neisseria Gonorrhea: NEGATIVE

## 2023-10-23 LAB — PANORAMA PRENATAL TEST FULL PANEL:PANORAMA TEST PLUS 5 ADDITIONAL MICRODELETIONS: FETAL FRACTION: 5.6

## 2023-10-27 LAB — HORIZON CUSTOM: REPORT SUMMARY: NEGATIVE

## 2023-11-19 ENCOUNTER — Other Ambulatory Visit: Payer: Self-pay

## 2023-11-19 ENCOUNTER — Ambulatory Visit: Admitting: Obstetrics & Gynecology

## 2023-11-19 VITALS — BP 134/72 | HR 101 | Wt 173.0 lb

## 2023-11-19 DIAGNOSIS — Z3A15 15 weeks gestation of pregnancy: Secondary | ICD-10-CM

## 2023-11-19 DIAGNOSIS — Z3491 Encounter for supervision of normal pregnancy, unspecified, first trimester: Secondary | ICD-10-CM

## 2023-11-19 NOTE — Progress Notes (Signed)
   PRENATAL VISIT NOTE  Subjective:  Shannon Murillo is a 23 y.o. G1P0 at [redacted]w[redacted]d being seen today for ongoing prenatal care.  She is currently monitored for the following issues for this low-risk pregnancy and has Supervision of low-risk pregnancy on their problem list.  Patient reports no complaints.  Contractions: Not present. Vag. Bleeding: None.  Movement: Present. Denies leaking of fluid.   The following portions of the patient's history were reviewed and updated as appropriate: allergies, current medications, past family history, past medical history, past social history, past surgical history and problem list.   Objective:    Vitals:   11/19/23 1107  BP: 134/72  Pulse: (!) 101  Weight: 173 lb (78.5 kg)    Fetal Status:  Fetal Heart Rate (bpm): 147   Movement: Present    General: Alert, oriented and cooperative. Patient is in no acute distress.  Skin: Skin is warm and dry. No rash noted.   Cardiovascular: Normal heart rate noted  Respiratory: Normal respiratory effort, no problems with respiration noted  Abdomen: Soft, gravid, appropriate for gestational age.  Pain/Pressure: Absent     Pelvic: Cervical exam deferred        Extremities: Normal range of motion.  Edema: None  Mental Status: Normal mood and affect. Normal behavior. Normal judgment and thought content.   Assessment and Plan:  Pregnancy: G1P0 at [redacted]w[redacted]d 1. [redacted] weeks gestation of pregnancy 2. Encounter for supervision of low-risk pregnancy in first trimester (Primary) LR NIPS.  AFP desired by patient.  Already scheduled for anatomy scan. - AFP, Serum, Open Spina Bifida No other complaints or concerns.  Routine obstetric precautions reviewed.  Please refer to After Visit Summary for other counseling recommendations.   Return in about 4 weeks (around 12/17/2023) for OFFICE OB VISIT (MD or APP).  Future Appointments  Date Time Provider Department Center  12/17/2023  1:00 PM [facility] PROVIDER 1 [facility]   12/17/2023  1:30 PM [facility] US1 [facility]    Gloris Hugger, MD

## 2023-11-24 ENCOUNTER — Ambulatory Visit: Payer: Self-pay | Admitting: Obstetrics & Gynecology

## 2023-11-24 DIAGNOSIS — Z3492 Encounter for supervision of normal pregnancy, unspecified, second trimester: Secondary | ICD-10-CM

## 2023-11-24 LAB — AFP, SERUM, OPEN SPINA BIFIDA
AFP MoM: 0.79
AFP Value: 22.5 ng/mL
Gest. Age on Collection Date: 15.6 wk
Maternal Age At EDD: 21.9 a
OSBR Risk 1 IN: 10000
Test Results:: NEGATIVE
Weight: 173 [lb_av]

## 2023-11-30 ENCOUNTER — Encounter: Payer: Self-pay | Admitting: Obstetrics and Gynecology

## 2023-12-17 ENCOUNTER — Ambulatory Visit (INDEPENDENT_AMBULATORY_CARE_PROVIDER_SITE_OTHER): Admitting: Family Medicine

## 2023-12-17 ENCOUNTER — Encounter: Payer: Self-pay | Admitting: Family Medicine

## 2023-12-17 ENCOUNTER — Ambulatory Visit (HOSPITAL_BASED_OUTPATIENT_CLINIC_OR_DEPARTMENT_OTHER)

## 2023-12-17 ENCOUNTER — Ambulatory Visit: Attending: Maternal & Fetal Medicine | Admitting: Maternal & Fetal Medicine

## 2023-12-17 ENCOUNTER — Other Ambulatory Visit: Payer: Self-pay

## 2023-12-17 VITALS — BP 151/79 | HR 98

## 2023-12-17 VITALS — BP 130/88 | HR 106 | Wt 189.5 lb

## 2023-12-17 DIAGNOSIS — Z3492 Encounter for supervision of normal pregnancy, unspecified, second trimester: Secondary | ICD-10-CM | POA: Insufficient documentation

## 2023-12-17 DIAGNOSIS — Z3A19 19 weeks gestation of pregnancy: Secondary | ICD-10-CM

## 2023-12-17 DIAGNOSIS — Z3491 Encounter for supervision of normal pregnancy, unspecified, first trimester: Secondary | ICD-10-CM | POA: Diagnosis present

## 2023-12-17 DIAGNOSIS — Z363 Encounter for antenatal screening for malformations: Secondary | ICD-10-CM | POA: Insufficient documentation

## 2023-12-17 DIAGNOSIS — Z3481 Encounter for supervision of other normal pregnancy, first trimester: Secondary | ICD-10-CM

## 2023-12-17 DIAGNOSIS — Z3689 Encounter for other specified antenatal screening: Secondary | ICD-10-CM

## 2023-12-17 NOTE — Progress Notes (Signed)
 Patient information  Patient Name: Shannon Murillo  Patient MRN:   [id number]  Referring practice: MFM Referring Provider: [facility])  Problem List   Patient Active Problem List   Diagnosis Date Noted   Supervision of low-risk pregnancy 10/05/2023    Maternal Fetal Medicine Consult Shannon Murillo is a 23 y.o. G1P0 at [redacted]w[redacted]d here for ultrasound and consultation. She had low risk aneuploidy screening of a female fetus. Carrier screening was Negative for the basic screening (SMA, alpha-thal, beta-thal, and cystic fibroisis. Maternal serum AFP was negative. She has no acute concerns.   She is doing well today.  Her pregnancy has been uncomplicated and she has no concerns today.  I encouraged her to follow-up with her OB provider and reassured her that her ultrasound appears normal.  Sonographic findings Single intrauterine pregnancy at 19w 5d. Fetal cardiac activity:  Observed and appears normal. Presentation: Breech. The anatomic structures that were well seen appear normal. The anatomic survey is complete.  Fetal biometry shows the estimated fetal weight at the 62 percentile. Amniotic fluid: Within normal limits.  MVP: 4.27 cm. Placenta: Anterior. Adnexa: No abnormality visualized. Cervical length: 3.7 cm.  There are limitations of prenatal ultrasound such as the inability to detect certain abnormalities due to poor visualization. Various factors such as fetal position, gestational age and maternal body habitus may increase the difficulty in visualizing the fetal anatomy.    Recommendations -EDD should be 05/07/2024 based on  U/S C R L  (10/13/23). -No further ultrasounds are recommended at this time based on the current indications. If future indications arise (e.g. size/date discrepancy on fundal height, gestational diabetes or hypertension) and an ultrasound is to be desired at our MFM office, please send a referral.    Review of Systems: A review of  systems was performed and was negative except per HPI   Past Obstetrical History:  OB History  Gravida Para Term Preterm AB Living  1       SAB IAB Ectopic Multiple Live Births          # Outcome Date GA Lbr Len/2nd Weight Sex Type Anes PTL Lv  1 Current              Past Medical History:  Past Medical History:  Diagnosis Date   Acute cystitis 12/31/2022   Anxiety    Bilious vomiting with nausea 04/17/2019   Constipation 04/17/2019   Depression    Dysmenorrhea 01/23/2019   Epigastric pain 04/17/2019   History of depression 10/04/2023   Hx of headache 01/23/2020     Past Surgical History:    Past Surgical History:  Procedure Laterality Date   WISDOM TOOTH EXTRACTION       Home Medications:   Current Outpatient Medications on File Prior to Visit  Medication Sig Dispense Refill   Prenatal Vit-Fe Fumarate-FA (MULTIVITAMIN-PRENATAL) 27-0.8 MG TABS tablet Take 1 tablet by mouth daily at 12 noon.     No current facility-administered medications on file prior to visit.      Allergies:   No Known Allergies   Physical Exam:   Vitals:   12/17/23 1258  BP: (!) 151/79  Pulse: 98   Sitting comfortably on the sonogram table Nonlabored breathing Normal rate and rhythm Abdomen is nontender  Thank you for the opportunity to be involved with this patient's care. Please let us  know if we can be of any further assistance.   30 minutes of time  was spent reviewing the patient's chart including labs, imaging and documentation.  At least 50% of this time was spent with direct patient care discussing the diagnosis, management and prognosis of her care.  Delora Smaller MFM, [facility]   12/17/2023  2:38 PM

## 2023-12-17 NOTE — Progress Notes (Signed)
   PRENATAL VISIT NOTE  Subjective:  Shannon Murillo is a 23 y.o. G1P0 at [redacted]w[redacted]d being seen today for ongoing prenatal care.  She is currently monitored for the following issues for this low-risk pregnancy and has Supervision of low-risk pregnancy on their problem list.  Patient reports no bleeding, no contractions, no cramping, and no leaking.  Contractions: Not present. Vag. Bleeding: None.  Movement: Present. Denies leaking of fluid.   The following portions of the patient's history were reviewed and updated as appropriate: allergies, current medications, past family history, past medical history, past social history, past surgical history and problem list.   Objective:    Vitals:   12/17/23 1020  BP: 130/88  Pulse: (!) 106  Weight: 189 lb 8 oz (86 kg)    Fetal Status:  Fetal Heart Rate (bpm): 150   Movement: Present    General: Alert, oriented and cooperative. Patient is in no acute distress.  Skin: Skin is warm and dry. No rash noted.   Cardiovascular: Normal heart rate noted  Respiratory: Normal respiratory effort, no problems with respiration noted  Abdomen: Soft, gravid, appropriate for gestational age.  Pain/Pressure: Absent     Pelvic: Cervical exam deferred        Extremities: Normal range of motion.  Edema: Trace  Mental Status: Normal mood and affect. Normal behavior. Normal judgment and thought content.   Assessment and Plan:  Pregnancy: G1P0 at [redacted]w[redacted]d 1. Encounter for supervision of low-risk pregnancy in second trimester (Primary) FHR BP appropriate today  2. [redacted] weeks gestation of pregnancy Continue routine prenatal care  Preterm labor symptoms and general obstetric precautions including but not limited to vaginal bleeding, contractions, leaking of fluid and fetal movement were reviewed in detail with the patient. Please refer to After Visit Summary for other counseling recommendations.   No follow-ups on file.  Future Appointments  Date Time Provider  Department Center  12/17/2023  1:00 PM [facility] PROVIDER 1 [facility]  12/17/2023  1:30 PM [facility] US1 [facility]  01/19/2024  1:15 PM Ilean Norleen GAILS, MD [facility]  02/11/2024  8:20 AM [facility]  02/11/2024  8:55 AM Lola, Donnice HERO, MD [facility]    Norleen GAILS Ilean, MD

## 2024-01-19 ENCOUNTER — Ambulatory Visit (INDEPENDENT_AMBULATORY_CARE_PROVIDER_SITE_OTHER): Admitting: Student

## 2024-01-19 ENCOUNTER — Other Ambulatory Visit: Payer: Self-pay

## 2024-01-19 ENCOUNTER — Encounter: Payer: Self-pay | Admitting: Student

## 2024-01-19 VITALS — BP 132/86 | HR 107 | Wt 205.8 lb

## 2024-01-19 DIAGNOSIS — Z3A24 24 weeks gestation of pregnancy: Secondary | ICD-10-CM

## 2024-01-19 DIAGNOSIS — Z3492 Encounter for supervision of normal pregnancy, unspecified, second trimester: Secondary | ICD-10-CM | POA: Diagnosis not present

## 2024-01-19 NOTE — Progress Notes (Signed)
   PRENATAL VISIT NOTE  Subjective:  Shannon Murillo is a 23 y.o. G1P0 at [redacted]w[redacted]d being seen today for ongoing prenatal care.  She is currently monitored for the following issues for this low-risk pregnancy and has Supervision of low-risk pregnancy on their problem list.  Patient reports no complaints.  Contractions: Not present. Vag. Bleeding: None.  Movement: Present. Denies leaking of fluid.   The following portions of the patient's history were reviewed and updated as appropriate: allergies, current medications, past family history, past medical history, past social history, past surgical history and problem list.   Objective:   Vitals:   01/19/24 1320  BP: 132/86  Pulse: (!) 107  Weight: 205 lb 12.8 oz (93.4 kg)    Fetal Status:  Fetal Heart Rate (bpm): 145 Fundal Height: 24 cm Movement: Present    General: Alert, oriented and cooperative. Patient is in no acute distress.  Skin: Skin is warm and dry. No rash noted.   Cardiovascular: Normal heart rate noted  Respiratory: Normal respiratory effort, no problems with respiration noted  Abdomen: Soft, gravid, appropriate for gestational age.  Pain/Pressure: Absent     Pelvic: Cervical exam deferred        Extremities: Normal range of motion.  Edema: Trace  Mental Status: Normal mood and affect. Normal behavior. Normal judgment and thought content.      10/15/2023   11:45 AM  Depression screen PHQ 2/9  Decreased Interest 0  Down, Depressed, Hopeless 0  PHQ - 2 Score 0  Altered sleeping 1  Tired, decreased energy 1  Change in appetite 1  Feeling bad or failure about yourself  0  Trouble concentrating 2  Moving slowly or fidgety/restless 0  Suicidal thoughts 0  PHQ-9 Score 5      Data saved with a previous flowsheet row definition        10/15/2023   11:45 AM  GAD 7 : Generalized Anxiety Score  Nervous, Anxious, on Edge 1  Control/stop worrying 1  Worry too much - different things 1  Trouble relaxing 1  Restless 0   Easily annoyed or irritable 2  Afraid - awful might happen 0  Total GAD 7 Score 6    Assessment and Plan:  Pregnancy: G1P0 at [redacted]w[redacted]d 1. Encounter for supervision of low-risk pregnancy in second trimester (Primary) - Doing well  2. [redacted] weeks gestation of pregnancy - continue routine care   Preterm labor symptoms and general obstetric precautions including but not limited to vaginal bleeding, contractions, leaking of fluid and fetal movement were reviewed in detail with the patient. Please refer to After Visit Summary for other counseling recommendations.   Return in about 4 weeks (around 02/16/2024) for LOB/GTT, IN-PERSON.  Future Appointments  Date Time Provider Department Center  02/11/2024  8:20 AM [facility]  02/11/2024  8:55 AM Lola Donnice HERO, MD [facility]    Nat Dauer, NP

## 2024-02-07 ENCOUNTER — Other Ambulatory Visit: Payer: Self-pay

## 2024-02-07 DIAGNOSIS — Z3492 Encounter for supervision of normal pregnancy, unspecified, second trimester: Secondary | ICD-10-CM

## 2024-02-07 NOTE — Addendum Note (Signed)
 Addended by: ELBY WADDELL CROME on: 02/07/2024 05:13 PM   Modules accepted: Orders

## 2024-02-11 ENCOUNTER — Other Ambulatory Visit: Payer: Self-pay

## 2024-02-11 ENCOUNTER — Other Ambulatory Visit

## 2024-02-11 ENCOUNTER — Ambulatory Visit: Admitting: Family Medicine

## 2024-02-11 ENCOUNTER — Encounter: Payer: Self-pay | Admitting: Family Medicine

## 2024-02-11 VITALS — BP 124/78 | HR 109 | Wt 212.0 lb

## 2024-02-11 DIAGNOSIS — Z3A27 27 weeks gestation of pregnancy: Secondary | ICD-10-CM

## 2024-02-11 DIAGNOSIS — Z23 Encounter for immunization: Secondary | ICD-10-CM

## 2024-02-11 DIAGNOSIS — Z3492 Encounter for supervision of normal pregnancy, unspecified, second trimester: Secondary | ICD-10-CM | POA: Diagnosis not present

## 2024-02-11 DIAGNOSIS — Z3493 Encounter for supervision of normal pregnancy, unspecified, third trimester: Secondary | ICD-10-CM

## 2024-02-11 LAB — CBC
Hematocrit: 36.5 % (ref 34.0–46.6)
Hemoglobin: 11.7 g/dL (ref 11.1–15.9)
MCH: 30.3 pg (ref 26.6–33.0)
MCHC: 32.1 g/dL (ref 31.5–35.7)
MCV: 95 fL (ref 79–97)
Platelets: 361 x10E3/uL (ref 150–450)
RBC: 3.86 x10E6/uL (ref 3.77–5.28)
RDW: 11.7 % (ref 11.7–15.4)
WBC: 11.4 x10E3/uL — ABNORMAL HIGH (ref 3.4–10.8)

## 2024-02-11 NOTE — Patient Instructions (Signed)

## 2024-02-11 NOTE — Progress Notes (Signed)
" ° °  Subjective:  Shannon Murillo is a 23 y.o. G1P0 at [redacted]w[redacted]d being seen today for ongoing prenatal care.  She is currently monitored for the following issues for this low-risk pregnancy and has Supervision of low-risk pregnancy on their problem list.  Patient reports abdominal tightness.  Contractions: Not present. Vag. Bleeding: None.  Movement: Present. Denies leaking of fluid.   The following portions of the patient's history were reviewed and updated as appropriate: allergies, current medications, past family history, past medical history, past social history, past surgical history and problem list. Problem list updated.  Objective:   Vitals:   02/11/24 0826  BP: 124/78  Pulse: (!) 109  Weight: 212 lb (96.2 kg)    Fetal Status: Fetal Heart Rate (bpm): 150   Movement: Present     General:  Alert, oriented and cooperative. Patient is in no acute distress.  Skin: Skin is warm and dry. No rash noted.   Cardiovascular: Normal heart rate noted  Respiratory: Normal respiratory effort, no problems with respiration noted  Abdomen: Soft, gravid, appropriate for gestational age. Pain/Pressure: Absent     Pelvic: Vag. Bleeding: None     Cervical exam deferred        Extremities: Normal range of motion.  Edema: None  Mental Status: Normal mood and affect. Normal behavior. Normal judgment and thought content.   Urinalysis:      Assessment and Plan:  Pregnancy: G1P0 at [redacted]w[redacted]d  1. [redacted] weeks gestation of pregnancy (Primary)   2. Encounter for supervision of low-risk pregnancy in third trimester BP and FHR normal Some cramping, most c/w round ligament pain, discussed differences and warning signs of preterm labor that should prompt MAU visit Third trimester labs today TDAP given today  Preterm labor symptoms and general obstetric precautions including but not limited to vaginal bleeding, contractions, leaking of fluid and fetal movement were reviewed in detail with the patient. Please refer  to After Visit Summary for other counseling recommendations.  Return in about 2 weeks (around 02/25/2024) for [facility], ob visit.   Lola Donnice HERO, MD "

## 2024-02-11 NOTE — Addendum Note (Signed)
 Addended byBETHA BARTHOLOMEW BARMAN on: 02/11/2024 08:50 AM   Modules accepted: Orders

## 2024-02-12 LAB — SYPHILIS: RPR W/REFLEX TO RPR TITER AND TREPONEMAL ANTIBODIES, TRADITIONAL SCREENING AND DIAGNOSIS ALGORITHM: RPR Ser Ql: NONREACTIVE

## 2024-02-12 LAB — GLUCOSE TOLERANCE, 2 HOURS W/ 1HR
Glucose, 1 hour: 147 mg/dL (ref 70–179)
Glucose, 2 hour: 113 mg/dL (ref 70–152)
Glucose, Fasting: 82 mg/dL (ref 70–91)

## 2024-02-12 LAB — HIV ANTIBODY (ROUTINE TESTING W REFLEX): HIV Screen 4th Generation wRfx: NONREACTIVE

## 2024-02-14 ENCOUNTER — Ambulatory Visit: Payer: Self-pay | Admitting: Family Medicine

## 2024-02-14 DIAGNOSIS — Z3493 Encounter for supervision of normal pregnancy, unspecified, third trimester: Secondary | ICD-10-CM

## 2024-02-28 ENCOUNTER — Encounter (HOSPITAL_COMMUNITY): Payer: Self-pay | Admitting: Obstetrics & Gynecology

## 2024-02-28 ENCOUNTER — Other Ambulatory Visit: Payer: Self-pay

## 2024-02-28 ENCOUNTER — Inpatient Hospital Stay (HOSPITAL_COMMUNITY)
Admission: AD | Admit: 2024-02-28 | Discharge: 2024-02-28 | Disposition: A | Discharge status: Home / Self Care | Attending: Obstetrics & Gynecology | Admitting: Obstetrics & Gynecology

## 2024-02-28 ENCOUNTER — Encounter: Payer: Self-pay | Admitting: Obstetrics and Gynecology

## 2024-02-28 DIAGNOSIS — O26893 Other specified pregnancy related conditions, third trimester: Secondary | ICD-10-CM | POA: Insufficient documentation

## 2024-02-28 DIAGNOSIS — R030 Elevated blood-pressure reading, without diagnosis of hypertension: Secondary | ICD-10-CM | POA: Insufficient documentation

## 2024-02-28 DIAGNOSIS — Z3A30 30 weeks gestation of pregnancy: Secondary | ICD-10-CM | POA: Diagnosis not present

## 2024-02-28 DIAGNOSIS — R42 Dizziness and giddiness: Secondary | ICD-10-CM | POA: Insufficient documentation

## 2024-02-28 DIAGNOSIS — R55 Syncope and collapse: Secondary | ICD-10-CM | POA: Diagnosis present

## 2024-02-28 LAB — COMPREHENSIVE METABOLIC PANEL WITH GFR
ALT: 9 U/L (ref 0–44)
AST: 14 U/L — ABNORMAL LOW (ref 15–41)
Albumin: 3.6 g/dL (ref 3.5–5.0)
Alkaline Phosphatase: 90 U/L (ref 38–126)
Anion gap: 12 (ref 5–15)
BUN: 7 mg/dL (ref 6–20)
CO2: 22 mmol/L (ref 22–32)
Calcium: 9.6 mg/dL (ref 8.9–10.3)
Chloride: 102 mmol/L (ref 98–111)
Creatinine, Ser: 0.46 mg/dL (ref 0.44–1.00)
GFR, Estimated: >60 mL/min
Glucose, Bld: 78 mg/dL (ref 70–99)
Potassium: 4 mmol/L (ref 3.5–5.1)
Sodium: 136 mmol/L (ref 135–145)
Total Bilirubin: 0.2 mg/dL (ref 0.0–1.2)
Total Protein: 7 g/dL (ref 6.5–8.1)

## 2024-02-28 LAB — URINALYSIS, ROUTINE W REFLEX MICROSCOPIC
Bilirubin Urine: NEGATIVE
Glucose, UA: NEGATIVE mg/dL
Hgb urine dipstick: NEGATIVE
Ketones, ur: NEGATIVE mg/dL
Leukocytes,Ua: NEGATIVE
Nitrite: NEGATIVE
Protein, ur: NEGATIVE mg/dL
Specific Gravity, Urine: 1.009 (ref 1.005–1.030)
pH: 7 (ref 5.0–8.0)

## 2024-02-28 LAB — CBC
HCT: 34.4 % — ABNORMAL LOW (ref 36.0–46.0)
Hemoglobin: 11.7 g/dL — ABNORMAL LOW (ref 12.0–15.0)
MCH: 30.8 pg (ref 26.0–34.0)
MCHC: 34 g/dL (ref 30.0–36.0)
MCV: 90.5 fL (ref 80.0–100.0)
Platelets: 376 K/uL (ref 150–400)
RBC: 3.8 MIL/uL — ABNORMAL LOW (ref 3.87–5.11)
RDW: 11.9 % (ref 11.5–15.5)
WBC: 13 K/uL — ABNORMAL HIGH (ref 4.0–10.5)
nRBC: 0 % (ref 0.0–0.2)

## 2024-02-28 LAB — PROTEIN / CREATININE RATIO, URINE
Creatinine, Urine: 46 mg/dL
Total Protein, Urine: <6 mg/dL

## 2024-02-28 NOTE — MAU Note (Signed)
 Shannon Murillo is a 24 y.o. at [redacted]w[redacted]d here in MAU reporting: she was feeling dizzy and as if she were going to pass out while at work this morning.  Reports took BP, BP 70/44 and CBG 122 mg/dl.  States took BP again and BP 142/60.  Denies VB and LOF.  Reports +FM.  LMP: 08/02/2023 Onset of complaint: today Pain score: 0 Vitals:   02/28/24 1037  BP: 137/80  Pulse: (!) 112  Resp: 19  Temp: 98.2 F (36.8 C)  SpO2: 100%     FHT: 144 bpm  Lab orders placed from triage: UA

## 2024-02-28 NOTE — MAU Provider Note (Cosign Needed Addendum)
 " History     CSN: [id number]  Arrival date and time: 02/28/24 0957   Event Date/Time   First Provider Initiated Contact with Patient 02/28/24 1153      Chief Complaint  Patient presents with   Dizziness   Shannon Murillo is a 24 y.o. female G1PO currently [redacted]w[redacted]d presenting to MAU with chief complaint of dizziness and near syncopal episode. Patient reports that at approximately 0830 she began to feel warm, dizzy, and diaphoretic and reportedly appeared pale while at work. She reports having some associated blurred vision during the episode but denies any visual changes currently. Her coworker took her blood pressure which was reportedly 70/44 mmHg at that time. She then drank water, Gatorade zero sugar, and ate almonds and repeat BP approximately 15 minutes later was 142/60 mmHg. Patient states her coworker then took her blood glucose after eating which was 122. Patient reports having some elevated home blood pressure readings over the past two weeks but denies any history of chronic HTN or diagnosis of gestational HTN. She also reports having some lower back pain, round ligament pain, and mild dyspnea on exertion throughout pregnancy but denies changes in these symptoms today. She reports normal fetal movement. Denies vaginal bleeding or discharge, abdominal pain, fever, chills, recent illness, chest pain, nausea, vomiting, or changes in urinary habits.     OB History     Gravida  1   Para      Term      Preterm      AB      Living         SAB      IAB      Ectopic      Multiple      Live Births              Past Medical History:  Diagnosis Date   Acute cystitis 12/31/2022   Anxiety    Bilious vomiting with nausea 04/17/2019   Constipation 04/17/2019   Depression    Dysmenorrhea 01/23/2019   Epigastric pain 04/17/2019   History of depression 10/04/2023   Hx of headache 01/23/2020    Past Surgical History:  Procedure Laterality Date   WISDOM TOOTH  EXTRACTION      History reviewed. No pertinent family history.  Social History[1]  Allergies: Allergies[2]  Medications Prior to Admission  Medication Sig Dispense Refill Last Dose/Taking   Prenatal Vit-Fe Fumarate-FA (MULTIVITAMIN-PRENATAL) 27-0.8 MG TABS tablet Take 1 tablet by mouth daily at 12 noon.   02/27/2024    Review of Systems  Constitutional:  Negative for chills and fever.  Eyes:  Negative for visual disturbance.  Cardiovascular:  Negative for chest pain.  Gastrointestinal:  Negative for abdominal pain, nausea and vomiting.  Genitourinary:  Negative for dysuria and hematuria.  Neurological:  Positive for dizziness.  Psychiatric/Behavioral:  Negative for confusion.    Physical Exam   Blood pressure 117/71, pulse (!) 104, temperature 98.2 F (36.8 C), temperature source Oral, resp. rate 19, height 5' 5 (1.651 m), weight 98.7 kg, last menstrual period 08/02/2023, SpO2 98%.  Patient Vitals for the past 24 hrs:  BP Temp Temp src Pulse Resp SpO2 Height Weight  02/28/24 1215 117/71 -- -- (!) 104 -- 98 % -- --  02/28/24 1200 137/84 -- -- (!) 101 -- 98 % -- --  02/28/24 1145 129/76 -- -- (!) 103 -- 98 % -- --  02/28/24 1130 128/80 -- -- (!) 108 -- 98 % -- --  02/28/24 1115 138/77 -- -- (!) 107 -- 98 % -- --  02/28/24 1100 (!) 142/86 -- -- (!) 116 -- 97 % -- --  02/28/24 1058 (!) 146/89 -- -- (!) 117 -- -- -- --  02/28/24 1056 (!) 151/87 -- -- (!) 114 -- -- -- --  02/28/24 1055 137/87 -- -- (!) 112 -- 97 % -- --  02/28/24 1051 (!) 144/78 -- -- (!) 107 -- -- -- --  02/28/24 1037 137/80 98.2 F (36.8 C) Oral (!) 112 19 100 % -- --  02/28/24 1033 -- -- -- -- -- -- 5' 5 (1.651 m) 98.7 kg     Physical Exam Constitutional:      General: She is not in acute distress.    Appearance: Normal appearance. She is not toxic-appearing.  Cardiovascular:     Rate and Rhythm: Regular rhythm. Tachycardia present.  Pulmonary:     Effort: Pulmonary effort is normal. No respiratory  distress.     Breath sounds: No wheezing, rhonchi or rales.  Abdominal:     Palpations: Abdomen is soft.     Tenderness: There is no abdominal tenderness.  Musculoskeletal:     Right lower leg: No edema.     Left lower leg: No edema.  Skin:    General: Skin is warm and dry.  Neurological:     Mental Status: She is alert.     Deep Tendon Reflexes: Reflexes normal.     Comments: No clonus.      MAU Course  Procedures Results for orders placed or performed during the hospital encounter of 02/28/24 (from the past 24 hours)  CBC     Status: Abnormal   Collection Time: 02/28/24 11:18 AM  Result Value Ref Range   WBC 13.0 (H) 4.0 - 10.5 K/uL   RBC 3.80 (L) 3.87 - 5.11 MIL/uL   Hemoglobin 11.7 (L) 12.0 - 15.0 g/dL   HCT 65.5 (L) 63.9 - 53.9 %   MCV 90.5 80.0 - 100.0 fL   MCH 30.8 26.0 - 34.0 pg   MCHC 34.0 30.0 - 36.0 g/dL   RDW 88.0 88.4 - 84.4 %   Platelets 376 150 - 400 K/uL   nRBC 0.0 0.0 - 0.2 %  Comprehensive metabolic panel with GFR     Status: Abnormal   Collection Time: 02/28/24 11:18 AM  Result Value Ref Range   Sodium 136 135 - 145 mmol/L   Potassium 4.0 3.5 - 5.1 mmol/L   Chloride 102 98 - 111 mmol/L   CO2 22 22 - 32 mmol/L   Glucose, Bld 78 70 - 99 mg/dL   BUN 7 6 - 20 mg/dL   Creatinine, Ser 9.53 0.44 - 1.00 mg/dL   Calcium 9.6 8.9 - 89.6 mg/dL   Total Protein 7.0 6.5 - 8.1 g/dL   Albumin 3.6 3.5 - 5.0 g/dL   AST 14 (L) 15 - 41 U/L   ALT 9 0 - 44 U/L   Alkaline Phosphatase 90 38 - 126 U/L   Total Bilirubin 0.2 0.0 - 1.2 mg/dL   GFR, Estimated >39 >39 mL/min   Anion gap 12 5 - 15  Urinalysis, Routine w reflex microscopic -Urine, Clean Catch     Status: Abnormal   Collection Time: 02/28/24 11:26 AM  Result Value Ref Range   Color, Urine STRAW (A) YELLOW   APPearance CLEAR CLEAR   Specific Gravity, Urine 1.009 1.005 - 1.030   pH 7.0 5.0 - 8.0   Glucose,  UA NEGATIVE NEGATIVE mg/dL   Hgb urine dipstick NEGATIVE NEGATIVE   Bilirubin Urine NEGATIVE NEGATIVE    Ketones, ur NEGATIVE NEGATIVE mg/dL   Protein, ur NEGATIVE NEGATIVE mg/dL   Nitrite NEGATIVE NEGATIVE   Leukocytes,Ua NEGATIVE NEGATIVE  Protein / creatinine ratio, urine     Status: None   Collection Time: 02/28/24 11:26 AM  Result Value Ref Range   Creatinine, Urine 46 mg/dL   Total Protein, Urine <6 mg/dL   Protein Creatinine Ratio NOT CALCULATED <0.2 mg/mg    MDM 24 y.o. female presenting s/p near syncopal episode with reported abnormal blood pressure readings. Pt hypertensive upon arrival initially which improved throughout visit without intervention. UA negative for protein, CBC WNL, and CMP WNL. Fetal monitoring normal. Thorough chart review performed and no documented history of HTN or gestational HTN. Suspect vasovagal syncope. Patient asymptomatic throughout visit here. Encouraged close follow-up with OB clinic in one week.   Assessment and Plan  Dizziness in pregnancy  Suspect vasovagal episode. Counseled patient on appropriate hydration and slow position changes.  Elevated blood pressure readings in pregnancy  No formal diagnosis of gestational hypertension. Blood pressure returned to WNL during stay in MAU. Encouraged patient to bring home BP cuff to her next OB office visit for comparison with office cuff. Patient will follow-up with OB in one week. Counseled on return precautions including severe headache not relieved by medications, vision changes, RUQ pain. Patient in agreement with plan and questions were answered.    Franchot Budge 02/28/2024, 12:40 PM       Attestation of Supervision of Student:  I confirm that I have verified the information documented in the physician assistant students note and that I have also personally reperformed the history, physical exam and all medical decision making activities.  I have verified that all services and findings are accurately documented in this student's note; and I agree with management and plan as outlined in the  documentation. I have also made any necessary editorial changes.  History Deona T Habibi is a 24 y.o. G1P0 at [redacted]w[redacted]d who presents with hypertension & presyncope. While at work this morning she felt like she was going to pass out. BP was checked at the time & was 80s/40s. She ate & drank, f/u SBP was 140s.  Denies headache, visual disturbance or epigastric pain. Reports good fetal movement. Denies history of hypertension.   Physical exam Temp:  [98.2 F (36.8 C)] 98.2 F (36.8 C) (01/05 1037) Pulse Rate:  [101-117] 104 (01/05 1215) Resp:  [19] 19 (01/05 1037) BP: (117-151)/(71-89) 117/71 (01/05 1215) SpO2:  [97 %-100 %] 98 % (01/05 1215) Weight:  [98.7 kg] 98.7 kg (01/05 1033)  Physical Examination: General appearance - alert, well appearing, and in no distress Mental status - alert, oriented to person, place, and time Eyes - pupils equal and reactive, extraocular eye movements intact, sclera anicteric Chest - clear to auscultation, no wheezes, rales or rhonchi, symmetric air entry Heart - normal rate, regular rhythm, normal S1, S2, no murmurs, rubs, clicks or gallops Extremities - peripheral pulses normal, no pedal edema, no clubbing or cyanosis  NST:  Baseline: 135 bpm, Variability: Good {> 6 bpm), Accelerations: Reactive, and Decelerations: Absent    Assessment/Plan 1. Elevated BP without diagnosis of hypertension   2. [redacted] weeks gestation of pregnancy    -Episode this morning likely vasovagal. Asymptomatic since then.  -Some mild range BPs in MAU, last several normal. Patient initially denies any history of hypertension during or prior  to pregnancy but now reports she has had elevated BPs at home for the last 2 weeks. Encouraged to take BP cuff with her to next prenatal appointment.  -Preeclampsia labs normal today. Patient is asymptomatic. She has f/u with OB in 1 week. Discussed potential for gestational hypertension diagnosis. Reviewed preeclampsia precautions & reasons to return  to MAU.   Jerilynn Longs, NP 02/28/2024 7:04 PM        [1]  Social History Tobacco Use   Smoking status: Never   Smokeless tobacco: Never  Vaping Use   Vaping status: Former   Substances: Nicotine  Substance Use Topics   Alcohol use: No   Drug use: Never  [2] No Known Allergies  "

## 2024-03-06 ENCOUNTER — Other Ambulatory Visit: Payer: Self-pay

## 2024-03-06 ENCOUNTER — Ambulatory Visit: Payer: Self-pay | Admitting: Family Medicine

## 2024-03-06 VITALS — BP 135/79 | HR 103 | Wt 219.0 lb

## 2024-03-06 DIAGNOSIS — Z3A31 31 weeks gestation of pregnancy: Secondary | ICD-10-CM

## 2024-03-06 DIAGNOSIS — Z3493 Encounter for supervision of normal pregnancy, unspecified, third trimester: Secondary | ICD-10-CM | POA: Diagnosis not present

## 2024-03-06 NOTE — Progress Notes (Signed)
" ° °  PRENATAL VISIT NOTE  Subjective:  Shannon Murillo is a 24 y.o. G1P0 at [redacted]w[redacted]d being seen today for ongoing prenatal care.  She is currently monitored for the following issues for this low-risk pregnancy and has Supervision of low-risk pregnancy on their problem list.  Patient reports no complaints.  Contractions: Not present. Vag. Bleeding: None.  Movement: Present. Denies leaking of fluid.   The following portions of the patient's history were reviewed and updated as appropriate: allergies, current medications, past family history, past medical history, past social history, past surgical history and problem list.   Objective:   Vitals:   03/06/24 1145  BP: 135/79  Pulse: (!) 103  Weight: 219 lb (99.3 kg)    Fetal Status:  Fetal Heart Rate (bpm): 130 Fundal Height: 31 cm Movement: Present Presentation: Vertex  General: Alert, oriented and cooperative. Patient is in no acute distress.  Skin: Skin is warm and dry. No rash noted.   Cardiovascular: Normal heart rate noted  Respiratory: Normal respiratory effort, no problems with respiration noted  Abdomen: Soft, gravid, appropriate for gestational age.  Pain/Pressure: Absent     Pelvic: Cervical exam deferred        Extremities: Normal range of motion.  Edema: Mild pitting, slight indentation (BLE)  Mental Status: Normal mood and affect. Normal behavior. Normal judgment and thought content.      10/15/2023   11:45 AM  Depression screen PHQ 2/9  Decreased Interest 0  Down, Depressed, Hopeless 0  PHQ - 2 Score 0  Altered sleeping 1  Tired, decreased energy 1  Change in appetite 1  Feeling bad or failure about yourself  0  Trouble concentrating 2  Moving slowly or fidgety/restless 0  Suicidal thoughts 0  PHQ-9 Score 5      Data saved with a previous flowsheet row definition        10/15/2023   11:45 AM  GAD 7 : Generalized Anxiety Score  Nervous, Anxious, on Edge 1  Control/stop worrying 1  Worry too much - different  things 1  Trouble relaxing 1  Restless 0  Easily annoyed or irritable 2  Afraid - awful might happen 0  Total GAD 7 Score 6    Assessment and Plan:  Pregnancy: G1P0 at [redacted]w[redacted]d 1. Encounter for supervision of low-risk pregnancy in third trimester (Primary) Continue prenatal care. - Bab[employer]s Schedule Optimization  2. [redacted] weeks gestation of pregnancy   Preterm labor symptoms and general obstetric precautions including but not limited to vaginal bleeding, contractions, leaking of fluid and fetal movement were reviewed in detail with the patient. Please refer to After Visit Summary for other counseling recommendations.   Return in 2 weeks (on 03/20/2024).  Future Appointments  Date Time Provider Department Center  03/20/2024  9:35 AM Davis, Devon E, PA-C [facility]  04/03/2024  8:35 AM Ilean Norleen GAILS, MD [facility]  04/17/2024 10:55 AM Delores Nidia CROME, FNP [facility]  04/25/2024  1:35 PM Milly Olam DELENA EDDY [facility]  05/02/2024 10:15 AM [facility]  05/09/2024  9:35 AM [facility]    Glenys GORMAN Birk, MD  "

## 2024-03-13 ENCOUNTER — Encounter: Payer: Self-pay | Admitting: Obstetrics and Gynecology

## 2024-03-15 ENCOUNTER — Encounter: Payer: Self-pay | Admitting: Family Medicine

## 2024-03-15 DIAGNOSIS — O26893 Other specified pregnancy related conditions, third trimester: Secondary | ICD-10-CM

## 2024-03-15 MED ORDER — FAMOTIDINE 20 MG PO TABS: 20.0000 mg | ORAL_TABLET | Freq: Two times a day (BID) | ORAL | 0 refills | Status: AC

## 2024-03-20 ENCOUNTER — Encounter: Payer: Self-pay | Admitting: Physician Assistant

## 2024-03-21 ENCOUNTER — Encounter: Payer: Self-pay | Admitting: Family Medicine

## 2024-04-03 ENCOUNTER — Encounter: Payer: Self-pay | Admitting: Family Medicine

## 2024-04-17 ENCOUNTER — Encounter: Payer: Self-pay | Admitting: Obstetrics and Gynecology

## 2024-04-25 ENCOUNTER — Encounter: Payer: Self-pay | Admitting: Advanced Practice Midwife

## 2024-05-02 ENCOUNTER — Encounter: Payer: Self-pay | Admitting: Certified Nurse Midwife

## 2024-05-09 ENCOUNTER — Encounter: Payer: Self-pay | Admitting: Certified Nurse Midwife
